# Patient Record
Sex: Female | Born: 2005
Health system: Southern US, Community
[De-identification: ages and names within clinical notes are randomized; demographics above are authoritative.]

## PROBLEM LIST (undated history)

## (undated) DIAGNOSIS — J45909 Unspecified asthma, uncomplicated: Secondary | ICD-10-CM

---
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
## 2023-01-04 ENCOUNTER — Ambulatory Visit
Admission: RE | Admit: 2023-01-04 | Discharge: 2023-01-04 | Disposition: A | Discharge status: Home / Self Care | Payer: BC Managed Care – PPO | Source: Ambulatory Visit | Attending: Nurse Practitioner | Admitting: Nurse Practitioner

## 2023-01-04 VITALS — BP 103/72 | HR 83 | Temp 98.1°F | Resp 18

## 2023-01-04 DIAGNOSIS — J029 Acute pharyngitis, unspecified: Secondary | ICD-10-CM | POA: Diagnosis not present

## 2023-01-04 LAB — POCT MONO SCREEN (KUC): Mono, POC: NEGATIVE

## 2023-01-04 LAB — POCT RAPID STREP A (OFFICE): Rapid Strep A Screen: NEGATIVE

## 2023-01-04 NOTE — ED Notes (Signed)
Patient feeling dizzy and presented pale colored skin after blood draw. Vitals stable. Pt laid back and was given water and gingerale. Cold rag placed on forehead.

## 2023-01-04 NOTE — ED Triage Notes (Signed)
Pt presents with c/o sore throat and difficulty swallowing X 2 weeks.

## 2023-01-04 NOTE — ED Provider Notes (Signed)
[facility]    CSN: [id number] Arrival date & time: 01/04/23  [id number]      History   Chief Complaint Chief Complaint  Patient presents with   Sore Throat    Swollen lymph nodes in head, neck, and back (began swelling Thurs, Feb. 22); very painful sore throat that is getting worse each day - Entered by patient    HPI Shirley Molina is a 17 y.o. female  presents for evaluation of URI symptoms for 2 weeks.  Patient is [place] by mom.  Patient reports associated symptoms of your throat, swollen lymph nodes, fatigue. Denies N/V/D, cough, congestion, fevers, body aches, shortness of breath, abdominal pain. Patient does have a hx of exercise-induced asthma. no smoking.  States her manager had strep throat.  Pt has taken Tylenol/ibuprofen OTC for symptoms. Pt has no other concerns at this time.    Sore Throat    Past Medical History:  Diagnosis Date   Asthma     There are no problems to display for this patient.   History reviewed. No pertinent surgical history.  OB History   No obstetric history on file.      Home Medications    Prior to Admission medications   Medication Sig Start Date End Date Taking? Authorizing Provider  alclomethasone (ACLOVATE) 0.05 % cream Apply topically 2 (two) times daily as needed (Rash). 11/04/21   Sheffield, Vida Roller R, PA-C  ciclopirox (LOPROX) 0.77 % SUSP Apply 1 application topically at bedtime. 02/06/21   Warren Danes, PA-C  Multiple Vitamin (MULTIVITAMIN) capsule Take 1 capsule by mouth daily.    [provider]    Family History History reviewed. No pertinent family history.  Social History Social History   Tobacco Use   Smoking status: Never   Smokeless tobacco: Never  Vaping Use   Vaping Use: Never used  Substance Use Topics   Alcohol use: No   Drug use: No     Allergies   Cephalosporins   Review of Systems Review of Systems  Constitutional:  Positive for fatigue.  HENT:  Positive for sore throat.       Physical Exam Triage Vital Signs ED Triage Vitals  Enc Vitals Group     BP 01/04/23 1005 103/71     Pulse Rate 01/04/23 1005 (!) 102     Resp 01/04/23 1005 18     Temp 01/04/23 1005 98.9 F (37.2 C)     Temp Source 01/04/23 1005 Oral     SpO2 01/04/23 1005 98 %     Weight --      Height --      Head Circumference --      Peak Flow --      Pain Score 01/04/23 1003 7     Pain Loc --      Pain Edu? --      Excl. in [place]? --    No data found.  Updated Vital Signs BP 103/72   Pulse 83   Temp 98.1 F (36.7 C) (Oral)   Resp 18   LMP 01/02/2023 (Exact Date)   SpO2 98%   Visual Acuity Right Eye Distance:   Left Eye Distance:   Bilateral Distance:    Right Eye Near:   Left Eye Near:    Bilateral Near:     Physical Exam Vitals and nursing note reviewed.  Constitutional:      General: She is not in acute distress.    Appearance: She is well-developed.  She is not ill-appearing.  HENT:     Head: Normocephalic and atraumatic.     Right Ear: Tympanic membrane and ear canal normal.     Left Ear: Tympanic membrane and ear canal normal.     Nose: No congestion or rhinorrhea.     Mouth/Throat:     Mouth: Mucous membranes are moist.     Pharynx: Uvula midline. Oropharyngeal exudate and posterior oropharyngeal erythema present. No pharyngeal swelling or uvula swelling.     Tonsils: No tonsillar exudate or tonsillar abscesses. 2+ on the right. 2+ on the left.  Eyes:     Conjunctiva/sclera: Conjunctivae normal.     Pupils: Pupils are equal, round, and reactive to light.  Neck:     Comments: Left-sided posterior cervical adenopathy Cardiovascular:     Rate and Rhythm: Normal rate and regular rhythm.     Heart sounds: Normal heart sounds.  Pulmonary:     Effort: Pulmonary effort is normal.     Breath sounds: Normal breath sounds.  Musculoskeletal:     Cervical back: Normal range of motion and neck supple.  Lymphadenopathy:     Cervical: No cervical adenopathy.   Skin:    General: Skin is warm and dry.  Neurological:     General: No focal deficit present.     Mental Status: She is alert and oriented to person, place, and time.  Psychiatric:        Mood and Affect: Mood normal.        Behavior: Behavior normal.      UC Treatments / Results  Labs (all labs ordered are listed, but only abnormal results are displayed) Labs Reviewed  CULTURE, GROUP A STREP ([place])  MONONUCLEOSIS SCREEN  CBC WITH DIFFERENTIAL/PLATELET  POCT RAPID STREP A (OFFICE)  POCT MONO SCREEN ([facility])    EKG   Radiology No results found.  Procedures Procedures (including critical care time)  Medications Ordered in UC Medications - No data to display  Initial Impression / Assessment and Plan / UC Course  I have reviewed the triage vital signs and the nursing notes.  Pertinent labs & imaging results that were available during my care of the patient were reviewed by me and considered in my medical decision making (see chart for details).     Negative rapid strep, culture Negative mono Discussed symptoms and exam with patient.  Will draw CBC and monoscreen Will await results prior to initiating any treatment to discuss supportive care until then Follow-up with PCP in 2 days Strict ER precautions reviewed and patient/mother verbalized understanding Final Clinical Impressions(s) / UC Diagnoses   Final diagnoses:  Sore throat   Discharge Instructions   None    ED Prescriptions   None    PDMP not reviewed this encounter.   Melynda Ripple, NP 01/04/23 1110

## 2023-01-04 NOTE — Discharge Instructions (Signed)
The clinic contact you with results of the testing done today Rest and fluids Over-the-counter ibuprofen or Tylenol as needed Salt water gargles and warm liquids Follow-up with your PCP in 2 days for recheck Please go to the emergency room for any worsening symptoms

## 2023-01-05 LAB — CBC WITH DIFFERENTIAL/PLATELET
Basophils Absolute: 0.1 10*3/uL (ref 0.0–0.2)
Basos: 1 %
EOS (ABSOLUTE): 0 10*3/uL (ref 0.0–0.4)
Eos: 0 %
Hematocrit: 38.7 % (ref 34.0–46.6)
Hemoglobin: 13.2 g/dL (ref 11.1–15.9)
Immature Grans (Abs): 0 10*3/uL (ref 0.0–0.1)
Immature Granulocytes: 0 %
Lymphocytes Absolute: 3.7 10*3/uL — ABNORMAL HIGH (ref 0.7–3.1)
Lymphs: 45 %
MCH: 31.5 pg (ref 26.6–33.0)
MCHC: 34.1 g/dL (ref 31.5–35.7)
MCV: 92 fL (ref 79–97)
Monocytes Absolute: 0.4 10*3/uL (ref 0.1–0.9)
Monocytes: 5 %
Neutrophils Absolute: 3.9 10*3/uL (ref 1.4–7.0)
Neutrophils: 49 %
Platelets: 221 10*3/uL (ref 150–450)
RBC: 4.19 x10E6/uL (ref 3.77–5.28)
RDW: 12.5 % (ref 11.7–15.4)
WBC: 8.1 10*3/uL (ref 3.4–10.8)

## 2023-01-05 LAB — MONONUCLEOSIS SCREEN: Mono Screen: POSITIVE — AB

## 2023-01-06 DIAGNOSIS — F4389 Other reactions to severe stress: Secondary | ICD-10-CM | POA: Diagnosis not present

## 2023-01-07 LAB — CULTURE, GROUP A STREP (THRC)

## 2023-01-13 DIAGNOSIS — B2790 Infectious mononucleosis, unspecified without complication: Secondary | ICD-10-CM | POA: Diagnosis not present

## 2023-02-02 DIAGNOSIS — F4389 Other reactions to severe stress: Secondary | ICD-10-CM | POA: Diagnosis not present

## 2023-02-16 DIAGNOSIS — F4389 Other reactions to severe stress: Secondary | ICD-10-CM | POA: Diagnosis not present

## 2023-03-30 DIAGNOSIS — F4389 Other reactions to severe stress: Secondary | ICD-10-CM | POA: Diagnosis not present

## 2023-04-08 DIAGNOSIS — F4389 Other reactions to severe stress: Secondary | ICD-10-CM | POA: Diagnosis not present

## 2023-04-30 DIAGNOSIS — F4389 Other reactions to severe stress: Secondary | ICD-10-CM | POA: Diagnosis not present

## 2023-06-08 DIAGNOSIS — Z309 Encounter for contraceptive management, unspecified: Secondary | ICD-10-CM | POA: Diagnosis not present

## 2023-06-08 DIAGNOSIS — F4389 Other reactions to severe stress: Secondary | ICD-10-CM | POA: Diagnosis not present

## 2023-06-08 DIAGNOSIS — Z139 Encounter for screening, unspecified: Secondary | ICD-10-CM | POA: Diagnosis not present

## 2023-10-18 DIAGNOSIS — Z113 Encounter for screening for infections with a predominantly sexual mode of transmission: Secondary | ICD-10-CM | POA: Diagnosis not present

## 2023-10-18 DIAGNOSIS — Z30430 Encounter for insertion of intrauterine contraceptive device: Secondary | ICD-10-CM | POA: Diagnosis not present

## 2023-10-25 DIAGNOSIS — Z30431 Encounter for routine checking of intrauterine contraceptive device: Secondary | ICD-10-CM | POA: Diagnosis not present

## 2024-04-05 DIAGNOSIS — F4320 Adjustment disorder, unspecified: Secondary | ICD-10-CM | POA: Diagnosis not present

## 2024-06-06 DIAGNOSIS — L910 Hypertrophic scar: Secondary | ICD-10-CM | POA: Diagnosis not present

## 2024-06-06 DIAGNOSIS — L218 Other seborrheic dermatitis: Secondary | ICD-10-CM | POA: Diagnosis not present

## 2024-06-07 DIAGNOSIS — Z01419 Encounter for gynecological examination (general) (routine) without abnormal findings: Secondary | ICD-10-CM | POA: Diagnosis not present

## 2024-06-07 DIAGNOSIS — Z1330 Encounter for screening examination for mental health and behavioral disorders, unspecified: Secondary | ICD-10-CM | POA: Diagnosis not present
